# Patient Record
Sex: Female | Born: 1990 | Hispanic: Yes | Marital: Single | Smoking: Never smoker
Health system: Southern US, Community
[De-identification: ages and names within clinical notes are randomized; demographics above are authoritative.]

---
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
## 2013-12-05 ENCOUNTER — Emergency Department: Payer: Self-pay | Admitting: Emergency Medicine

## 2013-12-29 ENCOUNTER — Emergency Department: Payer: Self-pay | Admitting: Emergency Medicine

## 2016-12-02 ENCOUNTER — Other Ambulatory Visit: Payer: Self-pay | Admitting: Internal Medicine

## 2016-12-02 DIAGNOSIS — R4701 Aphasia: Secondary | ICD-10-CM

## 2016-12-02 DIAGNOSIS — G453 Amaurosis fugax: Secondary | ICD-10-CM

## 2016-12-23 ENCOUNTER — Ambulatory Visit
Admission: RE | Admit: 2016-12-23 | Discharge: 2016-12-23 | Disposition: A | Discharge status: Home / Self Care | Payer: Commercial Managed Care - PPO | Source: Ambulatory Visit | Attending: Internal Medicine | Admitting: Internal Medicine

## 2016-12-23 DIAGNOSIS — G453 Amaurosis fugax: Secondary | ICD-10-CM

## 2016-12-23 DIAGNOSIS — R4701 Aphasia: Secondary | ICD-10-CM

## 2016-12-23 MED ORDER — GADOBENATE DIMEGLUMINE 529 MG/ML IV SOLN
10.0000 mL | Freq: Once | INTRAVENOUS | Status: AC | PRN
  Administered 2016-12-23: 10 mL via INTRAVENOUS

## 2017-04-24 ENCOUNTER — Encounter: Payer: Self-pay | Admitting: Emergency Medicine

## 2017-04-24 ENCOUNTER — Emergency Department
Admission: EM | Admit: 2017-04-24 | Discharge: 2017-04-24 | Disposition: A | Discharge status: Home / Self Care | Payer: Commercial Managed Care - PPO | Attending: Emergency Medicine | Admitting: Emergency Medicine

## 2017-04-24 ENCOUNTER — Emergency Department: Payer: Commercial Managed Care - PPO

## 2017-04-24 DIAGNOSIS — Y92410 Unspecified street and highway as the place of occurrence of the external cause: Secondary | ICD-10-CM | POA: Diagnosis not present

## 2017-04-24 DIAGNOSIS — Y939 Activity, unspecified: Secondary | ICD-10-CM | POA: Insufficient documentation

## 2017-04-24 DIAGNOSIS — Y998 Other external cause status: Secondary | ICD-10-CM | POA: Insufficient documentation

## 2017-04-24 DIAGNOSIS — M545 Low back pain: Secondary | ICD-10-CM | POA: Diagnosis present

## 2017-04-24 MED ORDER — CYCLOBENZAPRINE HCL 5 MG PO TABS: 5.0000 mg | ORAL_TABLET | Freq: Three times a day (TID) | ORAL | 0 refills | Status: AC | PRN

## 2017-04-24 MED ORDER — KETOROLAC TROMETHAMINE 30 MG/ML IJ SOLN
30.0000 mg | Freq: Once | INTRAMUSCULAR | Status: AC
  Administered 2017-04-24: 30 mg via INTRAMUSCULAR
  Filled 2017-04-24: qty 1

## 2017-04-24 MED ORDER — CYCLOBENZAPRINE HCL 10 MG PO TABS
5.0000 mg | ORAL_TABLET | Freq: Once | ORAL | Status: AC
  Administered 2017-04-24: 5 mg via ORAL
  Filled 2017-04-24: qty 1

## 2017-04-24 MED ORDER — MELOXICAM 7.5 MG PO TABS: 7.5000 mg | ORAL_TABLET | Freq: Every day | ORAL | 1 refills | Status: AC

## 2017-04-24 NOTE — ED Notes (Signed)
Pt in xr
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
## 2019-10-14 ENCOUNTER — Ambulatory Visit: Payer: Commercial Managed Care - PPO | Attending: Internal Medicine

## 2019-10-14 DIAGNOSIS — Z23 Encounter for immunization: Secondary | ICD-10-CM

## 2019-10-14 NOTE — Progress Notes (Signed)
   Covid-19 Vaccination Clinic  Name:  Laurie Baldwin    MRN: [id number] DOB: May 19, 1991  10/14/2019  [name] was observed post Covid-19 immunization for 15 minutes without incident. She was provided with Vaccine Information Sheet and instruction to access the V-Safe system.   Ms. Touch was instructed to call 911 with any severe reactions post vaccine: Marland Kitchen Difficulty breathing  . Swelling of face and throat  . A fast heartbeat  . A bad rash all over body  . Dizziness and weakness   Immunizations Administered    Name Date Dose VIS Date Route   Pfizer COVID-19 Vaccine 10/14/2019  1:31 PM 0.3 mL 06/30/2019 Intramuscular   Manufacturer: [employer], [employer]   Lot: [id number]   NDC: [address]-1

## 2019-11-04 ENCOUNTER — Ambulatory Visit: Payer: Commercial Managed Care - PPO | Attending: Internal Medicine

## 2019-11-04 DIAGNOSIS — Z23 Encounter for immunization: Secondary | ICD-10-CM

## 2019-11-04 NOTE — Progress Notes (Signed)
   Covid-19 Vaccination Clinic  Name:  Laurie Baldwin    MRN: [id number] DOB: 12/18/1990  11/04/2019  [name] was observed post Covid-19 immunization for 15 minutes without incident. She was provided with Vaccine Information Sheet and instruction to access the V-Safe system.   [name] was instructed to call 911 with any severe reactions post vaccine: Marland Kitchen Difficulty breathing  . Swelling of face and throat  . A fast heartbeat  . A bad rash all over body  . Dizziness and weakness   Immunizations Administered    Name Date Dose VIS Date Route   Pfizer COVID-19 Vaccine 11/04/2019 12:53 PM 0.3 mL 06/30/2019 Intramuscular   Manufacturer: [employer], [employer]   Lot: [id number]   NDC: [address]-0
